# Patient Record
Sex: Female | Born: 1995 | Race: Black or African American | Hispanic: No | Marital: Single | Smoking: Never smoker
Health system: Southern US, Community
[De-identification: ages and names within clinical notes are randomized; demographics above are authoritative.]

## PROBLEM LIST (undated history)

## (undated) DIAGNOSIS — Z789 Other specified health status: Secondary | ICD-10-CM

## (undated) HISTORY — PX: WISDOM TOOTH EXTRACTION: SHX21

## (undated) HISTORY — PX: NO PAST SURGERIES: SHX2092

---
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
## 2016-10-22 ENCOUNTER — Encounter (HOSPITAL_COMMUNITY): Payer: Self-pay | Admitting: Emergency Medicine

## 2016-10-22 ENCOUNTER — Emergency Department (HOSPITAL_COMMUNITY)
Admission: EM | Admit: 2016-10-22 | Discharge: 2016-10-23 | Disposition: A | Discharge status: Home / Self Care | Payer: Self-pay | Attending: Emergency Medicine | Admitting: Emergency Medicine

## 2016-10-22 DIAGNOSIS — Z5321 Procedure and treatment not carried out due to patient leaving prior to being seen by health care provider: Secondary | ICD-10-CM | POA: Insufficient documentation

## 2016-10-22 DIAGNOSIS — R21 Rash and other nonspecific skin eruption: Secondary | ICD-10-CM | POA: Insufficient documentation

## 2016-10-22 NOTE — ED Notes (Signed)
Name called 3 times, no answer.

## 2016-10-22 NOTE — ED Triage Notes (Signed)
Patient reports persistent itchy skin rashes at torso, arms , back and face onset last week unrelieved by OTC Benadryl . Respirations unlabored , no fever , airway intact .

## 2016-10-23 ENCOUNTER — Encounter (HOSPITAL_COMMUNITY): Payer: Self-pay | Admitting: Emergency Medicine

## 2016-10-23 ENCOUNTER — Emergency Department (HOSPITAL_COMMUNITY)
Admission: EM | Admit: 2016-10-23 | Discharge: 2016-10-23 | Disposition: A | Discharge status: Home / Self Care | Payer: Self-pay | Attending: Emergency Medicine | Admitting: Emergency Medicine

## 2016-10-23 DIAGNOSIS — R21 Rash and other nonspecific skin eruption: Secondary | ICD-10-CM | POA: Insufficient documentation

## 2016-10-23 DIAGNOSIS — Z79899 Other long term (current) drug therapy: Secondary | ICD-10-CM | POA: Insufficient documentation

## 2016-10-23 MED ORDER — FAMOTIDINE 20 MG PO TABS: 20.0000 mg | ORAL_TABLET | Freq: Two times a day (BID) | ORAL | 0 refills | Status: DC

## 2016-10-23 MED ORDER — PREDNISONE 10 MG (21) PO TBPK: ORAL_TABLET | Freq: Every day | ORAL | 0 refills | Status: DC

## 2016-10-23 MED ORDER — DIPHENHYDRAMINE HCL 25 MG PO TABS
25.0000 mg | ORAL_TABLET | Freq: Four times a day (QID) | ORAL | 0 refills | Status: DC
Start: 2016-10-23 — End: 2017-01-12

## 2016-10-23 NOTE — ED Notes (Signed)
Pt called for room x2 and no answer

## 2016-10-23 NOTE — ED Notes (Signed)
Pt updated that she was called with no answer an hour ago, Charge Rn aware, pt aware that we will place her in the next available room, pt encouraged to stay in waiting area and listen for name being called

## 2016-10-23 NOTE — ED Notes (Signed)
Pt states she understands instructions. Home stable with steady gait with friend.. 

## 2016-10-23 NOTE — ED Notes (Signed)
Pt called x's 3 without response 

## 2016-10-23 NOTE — ED Triage Notes (Signed)
Pt sts itchy rash generalized worse on torso x 1 week

## 2016-10-23 NOTE — ED Provider Notes (Signed)
[facility]PT Provider Note   CSN: [id number] Arrival date & time: 10/23/16  1259  By signing my name below, I, Bianca Byrd, attest that this documentation has been prepared under the direction and in the presence of [employer]. Electronically Signed: Cynda Byrd, Scribe. 10/23/16. 5:47 PM.  History   Chief Complaint Chief Complaint  Patient presents with  . Rash   HPI Comments: Bianca Byrd is a 21 y.o. female with no pertinent past medical history, who presents to the Emergency Department complaining of a sudden-onset, persistent rash, which appeared three days ago. Patient states initially she only had the rash on her abdomen, but the rash has spread to her bilateral arms, face, and proximal legs. Patient states her rash began while at work, but denies any new work products. Patient denies any new soaps, detergents, or lotion. Patient reports associated itching. Patient reports taking benadryl with some relief. Patient denies any new exposure to animals or outside. Patient denies any trouble swallowing, swelling of the lips, tongue, or throat, chest pain, shortness of breath, or any additional symptoms.   The history is provided by the patient. No language interpreter was used.    History reviewed. No pertinent past medical history.  There are no active problems to display for this patient.   History reviewed. No pertinent surgical history.  OB History    No data available       Home Medications    Prior to Admission medications   Medication Sig Start Date End Date Taking? Authorizing Provider  diphenhydrAMINE (BENADRYL) 25 MG tablet Take 1 tablet (25 mg total) by mouth every 6 (six) hours. 10/23/16   Zlatan Hornback, Waylan Boga, PA-C  famotidine (PEPCID) 20 MG tablet Take 1 tablet (20 mg total) by mouth 2 (two) times daily. 10/23/16   Tiara Maultsby, Waylan Boga, PA-C  Norethindrone Acetate-Ethinyl Estrad-FE (LOESTRIN 24 FE) 1-20 MG-MCG(24) tablet Take 1 tablet by mouth daily.  08/19/15   Brock Bad, MD  predniSONE (STERAPRED UNI-PAK 21 TAB) 10 MG (21) TBPK tablet Take by mouth daily. Take 6 tabs by mouth daily  for 2 days, then 5 tabs for 2 days, then 4 tabs for 2 days, then 3 tabs for 2 days, 2 tabs for 2 days, then 1 tab by mouth daily for 2 days 10/23/16   Emi Holes, PA-C    Family History Family History  Problem Relation Age of Onset  . Cancer - Colon Mother     Social History Social History  Substance Use Topics  . Smoking status: Never Smoker  . Smokeless tobacco: Never Used  . Alcohol use No     Allergies   Patient has no known allergies.   Review of Systems Review of Systems  HENT: Negative for trouble swallowing.   Respiratory: Negative for chest tightness and shortness of breath.   Cardiovascular: Negative for chest pain.  Skin: Positive for rash (Abdomen, bilateral arms, and face).     Physical Exam Updated Vital Signs BP (!) 102/53 (BP Location: Right Arm)   Pulse (!) 54   Temp 98.3 F (36.8 C) (Oral)   Resp 16   Ht 5\' 7"  (1.702 m)   Wt 47.6 kg (105 lb)   LMP 10/02/2016 (Approximate)   SpO2 100%   BMI 16.45 kg/m   Physical Exam  Constitutional: She appears well-developed and well-nourished. No distress.  HENT:  Head: Normocephalic and atraumatic.  Mouth/Throat: Oropharynx is clear and moist. No oropharyngeal exudate.  Eyes: Conjunctivae are normal.  Pupils are equal, round, and reactive to light. Right eye exhibits no discharge. Left eye exhibits no discharge. No scleral icterus.  Neck: Normal range of motion. Neck supple. No thyromegaly present.  Cardiovascular: Regular rhythm, normal heart sounds and intact distal pulses.  Exam reveals no gallop and no friction rub.   No murmur heard. Pulmonary/Chest: Effort normal and breath sounds normal. No stridor. No respiratory distress. She has no wheezes. She has no rales.  Musculoskeletal: She exhibits no edema.  Lymphadenopathy:    She has no cervical adenopathy.    Neurological: She is alert. Coordination normal.  Skin: Skin is warm and dry. Rash noted. She is not diaphoretic. No pallor.  Generalized non tender papular rash to face, torso, and more sparse to the bilateral upper extremities.   Psychiatric: She has a normal mood and affect.  Nursing note and vitals reviewed.        ED Treatments / Results  DIAGNOSTIC STUDIES: Oxygen Saturation is 100% on RA, normal by my interpretation.    COORDINATION OF CARE: 5:46 PM Discussed treatment plan with pt at bedside and pt agreed to plan, which includes Pepcid, benadryl, and prednisone.   Labs (all labs ordered are listed, but only abnormal results are displayed) Labs Reviewed - No data to display  EKG  EKG Interpretation None       Radiology No results found.  Procedures Procedures (including critical care time)  Medications Ordered in ED Medications - No data to display   Initial Impression / Assessment and Plan / ED Course  I have reviewed the triage vital signs and the nursing notes.  Pertinent labs & imaging results that were available during my care of the patient were reviewed by me and considered in my medical decision making (see chart for details).     Suspect systemic allergic reaction to some unknown allergen. Rash spares palms and soles. Patient has no other symptoms and is afebrile. Doubt any other more emergent etiology at this time. Will treat with prednisone taper, Benadryl, Pepcid with strict return precautions. Patient understands and agrees with plan. Patient vitals stable throughout ED course and discharged in satisfactory condition. Patient also evaluated by Dr. Donnald GarrePfeiffer that of the patient's management and agrees with plan.  Final Clinical Impressions(s) / ED Diagnoses   Final diagnoses:  Rash and nonspecific skin eruption    New Prescriptions New Prescriptions   DIPHENHYDRAMINE (BENADRYL) 25 MG TABLET    Take 1 tablet (25 mg total) by mouth every 6  (six) hours.   FAMOTIDINE (PEPCID) 20 MG TABLET    Take 1 tablet (20 mg total) by mouth 2 (two) times daily.   PREDNISONE (STERAPRED UNI-PAK 21 TAB) 10 MG (21) TBPK TABLET    Take by mouth daily. Take 6 tabs by mouth daily  for 2 days, then 5 tabs for 2 days, then 4 tabs for 2 days, then 3 tabs for 2 days, 2 tabs for 2 days, then 1 tab by mouth daily for 2 days   I personally performed the services described in this documentation, which was scribed in my presence. The recorded information has been reviewed and is accurate.       Emi HolesLaw, Hiroto Saltzman M, PA-C 10/23/16 1755    Arby BarrettePfeiffer, Marcy, MD 10/24/16 [phone]

## 2016-10-23 NOTE — Discharge Instructions (Signed)
Medications: prednisone, Benadryl, Pepcid  Treatment: Take prednisone as prescribed for 12 days. Take Benadryl every 6 hours for the next 3 days and thereafter as needed for itching. Take Pepcid twice daily as prescribed for the next 3 days.  Follow-up: Please follow-up and establish care with a primary care provider for future allergy testing. You can call the number circled on your discharge paperwork. Please return to emergency department if you develop any new or worsening symptoms, or if your rash is not improving.

## 2017-01-01 ENCOUNTER — Emergency Department (HOSPITAL_COMMUNITY)
Admission: EM | Admit: 2017-01-01 | Discharge: 2017-01-01 | Disposition: A | Discharge status: Home / Self Care | Payer: Self-pay | Attending: Emergency Medicine | Admitting: Emergency Medicine

## 2017-01-01 ENCOUNTER — Emergency Department (HOSPITAL_COMMUNITY): Payer: Self-pay

## 2017-01-01 DIAGNOSIS — W500XXA Accidental hit or strike by another person, initial encounter: Secondary | ICD-10-CM | POA: Insufficient documentation

## 2017-01-01 DIAGNOSIS — Y9383 Activity, rough housing and horseplay: Secondary | ICD-10-CM | POA: Insufficient documentation

## 2017-01-01 DIAGNOSIS — Z79899 Other long term (current) drug therapy: Secondary | ICD-10-CM | POA: Insufficient documentation

## 2017-01-01 DIAGNOSIS — Y92008 Other place in unspecified non-institutional (private) residence as the place of occurrence of the external cause: Secondary | ICD-10-CM | POA: Insufficient documentation

## 2017-01-01 DIAGNOSIS — Y999 Unspecified external cause status: Secondary | ICD-10-CM | POA: Insufficient documentation

## 2017-01-01 DIAGNOSIS — S62610A Displaced fracture of proximal phalanx of right index finger, initial encounter for closed fracture: Secondary | ICD-10-CM | POA: Insufficient documentation

## 2017-01-01 MED ORDER — OXYCODONE-ACETAMINOPHEN 5-325 MG PO TABS: 1.0000 | ORAL_TABLET | Freq: Three times a day (TID) | ORAL | 0 refills | Status: DC | PRN

## 2017-01-01 NOTE — Progress Notes (Signed)
Orthopedic Tech Progress Note Patient Details:  Bianca Byrd [birth year] [id number]  Ortho Devices Type of Ortho Device: Short arm splint Ortho Device/Splint Location: applied short arm splint to pt right arm/hand (ulna gutter).  pt tolerated application very well.  provided arm sling to pt right arm for support.  right hand.  Ortho Device/Splint Interventions: Application, Adjustment   Alvina Chou 01/01/2017, 5:12 PM
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
## 2017-01-01 NOTE — ED Notes (Signed)
Ortho at bedside.

## 2017-01-01 NOTE — Discharge Instructions (Signed)
Please keep your splint clean and dry and in place until you're evaluated by the hand surgeon. You may cover with a plastic bag shower.  Please call the hand surgeon's office on Monday morning to schedule a follow-up appointment.  If you develop any numbness, weakness, or loss of sensation in the finger, or have a new fall or injury, please return to the emergency department for reevaluation.  800 mg of ibuprofen may be taken with food every 8 hours as needed for pain. You may also take 650 mg of Tylenol every 6 hours. For severe pain, you may take 1 tablet of Percocet once every 8 hours. Please do not drive or work after taking this medication because it can make you drowsy. Please note this medication is a narcotic and can be addicting so please only use it for severe pain.
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
## 2017-01-04 ENCOUNTER — Telehealth: Payer: Self-pay | Admitting: Orthopedic Surgery

## 2017-01-04 NOTE — Telephone Encounter (Signed)
Left message reiterating to f/u with [place] [facility] and left office # to call.    Freeman Caldron, PA-C Orthopedic Surgery [phone]
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
## 2017-01-12 ENCOUNTER — Encounter (HOSPITAL_COMMUNITY): Payer: Self-pay | Admitting: *Deleted

## 2017-01-13 ENCOUNTER — Ambulatory Visit (HOSPITAL_COMMUNITY): Payer: Self-pay | Admitting: Anesthesiology

## 2017-01-13 ENCOUNTER — Encounter (HOSPITAL_COMMUNITY): Payer: Self-pay | Admitting: Orthopedic Surgery

## 2017-01-13 ENCOUNTER — Ambulatory Visit (HOSPITAL_COMMUNITY)
Admission: RE | Admit: 2017-01-13 | Discharge: 2017-01-13 | Disposition: A | Discharge status: Home / Self Care | Payer: Self-pay | Source: Ambulatory Visit | Attending: Orthopedic Surgery | Admitting: Orthopedic Surgery

## 2017-01-13 ENCOUNTER — Encounter (HOSPITAL_COMMUNITY)
Admission: RE | Disposition: A | Discharge status: Home / Self Care | Payer: Self-pay | Source: Ambulatory Visit | Attending: Orthopedic Surgery

## 2017-01-13 DIAGNOSIS — X58XXXA Exposure to other specified factors, initial encounter: Secondary | ICD-10-CM | POA: Insufficient documentation

## 2017-01-13 DIAGNOSIS — S62610B Displaced fracture of proximal phalanx of right index finger, initial encounter for open fracture: Secondary | ICD-10-CM

## 2017-01-13 DIAGNOSIS — S62610A Displaced fracture of proximal phalanx of right index finger, initial encounter for closed fracture: Secondary | ICD-10-CM | POA: Insufficient documentation

## 2017-01-13 HISTORY — PX: OPEN REDUCTION INTERNAL FIXATION (ORIF) DISTAL PHALANX: SHX6236

## 2017-01-13 HISTORY — DX: Other specified health status: Z78.9

## 2017-01-13 LAB — CBC
HCT: 40.8 % (ref 36.0–46.0)
HEMOGLOBIN: 13.1 g/dL (ref 12.0–15.0)
MCH: 28.1 pg (ref 26.0–34.0)
MCHC: 32.1 g/dL (ref 30.0–36.0)
MCV: 87.6 fL (ref 78.0–100.0)
Platelets: 166 10*3/uL (ref 150–400)
RBC: 4.66 MIL/uL (ref 3.87–5.11)
RDW: 12.7 % (ref 11.5–15.5)
WBC: 6.3 10*3/uL (ref 4.0–10.5)

## 2017-01-13 LAB — HCG, SERUM, QUALITATIVE: PREG SERUM: NEGATIVE

## 2017-01-13 SURGERY — OPEN REDUCTION INTERNAL FIXATION (ORIF) DISTAL PHALANX
Anesthesia: General | Site: Index Finger | Laterality: Right

## 2017-01-13 MED ORDER — SCOPOLAMINE 1 MG/3DAYS TD PT72
MEDICATED_PATCH | TRANSDERMAL | Status: AC
  Filled 2017-01-13: qty 1

## 2017-01-13 MED ORDER — SCOPOLAMINE 1 MG/3DAYS TD PT72
MEDICATED_PATCH | TRANSDERMAL | Status: DC | PRN
  Administered 2017-01-13: 1 via TRANSDERMAL

## 2017-01-13 MED ORDER — LIDOCAINE 2% (20 MG/ML) 5 ML SYRINGE
INTRAMUSCULAR | Status: AC
  Filled 2017-01-13: qty 5

## 2017-01-13 MED ORDER — MIDAZOLAM HCL 2 MG/2ML IJ SOLN
INTRAMUSCULAR | Status: AC
  Filled 2017-01-13: qty 2

## 2017-01-13 MED ORDER — MIDAZOLAM HCL 2 MG/2ML IJ SOLN
INTRAMUSCULAR | Status: DC | PRN
  Administered 2017-01-13: 2 mg via INTRAVENOUS

## 2017-01-13 MED ORDER — LACTATED RINGERS IV SOLN
INTRAVENOUS | Status: DC
  Administered 2017-01-13: 15:00:00 via INTRAVENOUS

## 2017-01-13 MED ORDER — PROMETHAZINE HCL 25 MG/ML IJ SOLN: 6.2500 mg | INTRAMUSCULAR | Status: DC | PRN

## 2017-01-13 MED ORDER — ONDANSETRON HCL 4 MG/2ML IJ SOLN
INTRAMUSCULAR | Status: DC | PRN
  Administered 2017-01-13: 4 mg via INTRAVENOUS

## 2017-01-13 MED ORDER — BUPIVACAINE HCL (PF) 0.25 % IJ SOLN
INTRAMUSCULAR | Status: AC
  Filled 2017-01-13: qty 30

## 2017-01-13 MED ORDER — ONDANSETRON HCL 4 MG/2ML IJ SOLN
INTRAMUSCULAR | Status: AC
  Filled 2017-01-13: qty 2

## 2017-01-13 MED ORDER — HYDROMORPHONE HCL 1 MG/ML IJ SOLN
0.2500 mg | INTRAMUSCULAR | Status: DC | PRN
  Administered 2017-01-13 (×2): 0.5 mg via INTRAVENOUS

## 2017-01-13 MED ORDER — FENTANYL CITRATE (PF) 250 MCG/5ML IJ SOLN
INTRAMUSCULAR | Status: AC
  Filled 2017-01-13: qty 5

## 2017-01-13 MED ORDER — DEXAMETHASONE SODIUM PHOSPHATE 10 MG/ML IJ SOLN
INTRAMUSCULAR | Status: DC | PRN
  Administered 2017-01-13: 10 mg via INTRAVENOUS

## 2017-01-13 MED ORDER — CEFAZOLIN SODIUM-DEXTROSE 2-4 GM/100ML-% IV SOLN
2.0000 g | INTRAVENOUS | Status: AC
  Administered 2017-01-13: 2 g via INTRAVENOUS
  Filled 2017-01-13: qty 100

## 2017-01-13 MED ORDER — LIDOCAINE 2% (20 MG/ML) 5 ML SYRINGE
INTRAMUSCULAR | Status: DC | PRN
  Administered 2017-01-13: 60 mg via INTRAVENOUS

## 2017-01-13 MED ORDER — FENTANYL CITRATE (PF) 100 MCG/2ML IJ SOLN
INTRAMUSCULAR | Status: DC | PRN
  Administered 2017-01-13: 25 ug via INTRAVENOUS
  Administered 2017-01-13 (×2): 50 ug via INTRAVENOUS
  Administered 2017-01-13: 75 ug via INTRAVENOUS
  Administered 2017-01-13: 50 ug via INTRAVENOUS

## 2017-01-13 MED ORDER — PROPOFOL 10 MG/ML IV BOLUS
INTRAVENOUS | Status: DC | PRN
  Administered 2017-01-13: 180 mg via INTRAVENOUS

## 2017-01-13 MED ORDER — CHLORHEXIDINE GLUCONATE 4 % EX LIQD: 60.0000 mL | Freq: Once | CUTANEOUS | Status: DC

## 2017-01-13 MED ORDER — OXYCODONE-ACETAMINOPHEN 5-325 MG PO TABS
ORAL_TABLET | ORAL | Status: AC
  Administered 2017-01-13: 1
  Filled 2017-01-13: qty 1

## 2017-01-13 MED ORDER — PROPOFOL 10 MG/ML IV BOLUS
INTRAVENOUS | Status: AC
  Filled 2017-01-13: qty 20

## 2017-01-13 MED ORDER — DEXAMETHASONE SODIUM PHOSPHATE 10 MG/ML IJ SOLN
INTRAMUSCULAR | Status: AC
  Filled 2017-01-13: qty 1

## 2017-01-13 MED ORDER — OXYCODONE-ACETAMINOPHEN 5-325 MG PO TABS: 1.0000 | ORAL_TABLET | Freq: Three times a day (TID) | ORAL | 0 refills | Status: AC

## 2017-01-13 MED ORDER — HYDROMORPHONE HCL 1 MG/ML IJ SOLN
INTRAMUSCULAR | Status: AC
  Administered 2017-01-13: 0.5 mg via INTRAVENOUS
  Filled 2017-01-13: qty 1

## 2017-01-13 MED ORDER — BUPIVACAINE HCL (PF) 0.25 % IJ SOLN
INTRAMUSCULAR | Status: DC | PRN
  Administered 2017-01-13: 8.5 mL

## 2017-01-13 SURGICAL SUPPLY — 66 items
BANDAGE ACE 3X5.8 VEL STRL LF (GAUZE/BANDAGES/DRESSINGS) ×4 IMPLANT
BANDAGE ACE 4X5 VEL STRL LF (GAUZE/BANDAGES/DRESSINGS) IMPLANT
BIT DRILL 1.1 (BIT) ×1
BIT DRILL 1.1MM (BIT) ×1
BIT DRILL 60X20X1.1XQC TMX (BIT) ×2 IMPLANT
BIT DRL 60X20X1.1XQC TMX (BIT) ×2
BLADE CLIPPER SURG (BLADE) IMPLANT
BNDG ESMARK 4X9 LF (GAUZE/BANDAGES/DRESSINGS) ×4 IMPLANT
BNDG GAUZE ELAST 4 BULKY (GAUZE/BANDAGES/DRESSINGS) IMPLANT
CLOSURE WOUND 1/2 X4 (GAUZE/BANDAGES/DRESSINGS)
CORDS BIPOLAR (ELECTRODE) ×4 IMPLANT
COVER SURGICAL LIGHT HANDLE (MISCELLANEOUS) ×4 IMPLANT
CUFF TOURNIQUET SINGLE 18IN (TOURNIQUET CUFF) ×4 IMPLANT
CUFF TOURNIQUET SINGLE 24IN (TOURNIQUET CUFF) IMPLANT
DRAPE OEC MINIVIEW 54X84 (DRAPES) ×4 IMPLANT
DRAPE SURG 17X11 SM STRL (DRAPES) ×4 IMPLANT
DRIVER BIT 1.5 (TRAUMA) ×4 IMPLANT
DRSG ADAPTIC 3X8 NADH LF (GAUZE/BANDAGES/DRESSINGS) ×4 IMPLANT
GAUZE SPONGE 4X4 12PLY STRL (GAUZE/BANDAGES/DRESSINGS) ×4 IMPLANT
GAUZE SPONGE 4X4 16PLY XRAY LF (GAUZE/BANDAGES/DRESSINGS) IMPLANT
GLOVE BIO SURGEON STRL SZ 6.5 (GLOVE) ×3 IMPLANT
GLOVE BIO SURGEONS STRL SZ 6.5 (GLOVE) ×1
GLOVE BIOGEL PI IND STRL 6.5 (GLOVE) ×2 IMPLANT
GLOVE BIOGEL PI IND STRL 7.0 (GLOVE) ×2 IMPLANT
GLOVE BIOGEL PI IND STRL 8.5 (GLOVE) ×2 IMPLANT
GLOVE BIOGEL PI INDICATOR 6.5 (GLOVE) ×2
GLOVE BIOGEL PI INDICATOR 7.0 (GLOVE) ×2
GLOVE BIOGEL PI INDICATOR 8.5 (GLOVE) ×2
GLOVE ECLIPSE 6.5 STRL STRAW (GLOVE) ×4 IMPLANT
GLOVE SURG ORTHO 8.0 STRL STRW (GLOVE) ×4 IMPLANT
GOWN STRL REUS W/ TWL LRG LVL3 (GOWN DISPOSABLE) ×8 IMPLANT
GOWN STRL REUS W/ TWL XL LVL3 (GOWN DISPOSABLE) ×2 IMPLANT
GOWN STRL REUS W/TWL LRG LVL3 (GOWN DISPOSABLE) ×8
GOWN STRL REUS W/TWL XL LVL3 (GOWN DISPOSABLE) ×2
KIT BASIN OR (CUSTOM PROCEDURE TRAY) ×4 IMPLANT
KIT ROOM TURNOVER OR (KITS) ×4 IMPLANT
MANIFOLD NEPTUNE II (INSTRUMENTS) ×4 IMPLANT
NEEDLE HYPO 25X1 1.5 SAFETY (NEEDLE) ×4 IMPLANT
NS IRRIG 1000ML POUR BTL (IV SOLUTION) ×4 IMPLANT
PACK ORTHO EXTREMITY (CUSTOM PROCEDURE TRAY) ×4 IMPLANT
PAD ARMBOARD 7.5X6 YLW CONV (MISCELLANEOUS) ×8 IMPLANT
PAD CAST 4YDX4 CTTN HI CHSV (CAST SUPPLIES) ×2 IMPLANT
PADDING CAST COTTON 4X4 STRL (CAST SUPPLIES) ×2
PADDING UNDERCAST 2 STRL (CAST SUPPLIES) ×2
PADDING UNDERCAST 2X4 STRL (CAST SUPPLIES) ×2 IMPLANT
PLATE STRAIGHT LOCK 1.5 (Plate) ×4 IMPLANT
SCREW NL 1.5X11 WRIST (Screw) ×12 IMPLANT
SCREW NL 1.5X12 (Screw) ×8 IMPLANT
SOAP 2 % CHG 4 OZ (WOUND CARE) ×4 IMPLANT
SPLINT FINGER (SOFTGOODS) ×4 IMPLANT
STRIP CLOSURE SKIN 1/2X4 (GAUZE/BANDAGES/DRESSINGS) IMPLANT
SUT ETHIBOND 4 0 TF (SUTURE) ×4 IMPLANT
SUT ETHILON 4 0 PS 2 18 (SUTURE) IMPLANT
SUT MNCRL AB 3-0 PS2 18 (SUTURE) ×4 IMPLANT
SUT MNCRL AB 4-0 PS2 18 (SUTURE) IMPLANT
SUT PROLENE 4 0 PS 2 18 (SUTURE) IMPLANT
SUT VIC AB 2-0 FS1 27 (SUTURE) IMPLANT
SUT VICRYL 4-0 PS2 18IN ABS (SUTURE) IMPLANT
SUT VICRYL RAPIDE 4/0 PS 2 (SUTURE) ×4 IMPLANT
SYR CONTROL 10ML LL (SYRINGE) ×4 IMPLANT
TOWEL OR 17X24 6PK STRL BLUE (TOWEL DISPOSABLE) ×4 IMPLANT
TOWEL OR 17X26 10 PK STRL BLUE (TOWEL DISPOSABLE) ×4 IMPLANT
TUBE CONNECTING 12'X1/4 (SUCTIONS) ×1
TUBE CONNECTING 12X1/4 (SUCTIONS) ×3 IMPLANT
WATER STERILE IRR 1000ML POUR (IV SOLUTION) ×4 IMPLANT
YANKAUER SUCT BULB TIP NO VENT (SUCTIONS) IMPLANT

## 2017-01-13 NOTE — Transfer of Care (Signed)
Immediate Anesthesia Transfer of Care Note  Patient: Bianca Byrd  Procedure(s) Performed: Procedure(s): RIGHT INDEX FINGER CLOSED REDUCTION WITH PERCUTANEOUS SKELETAL FIXATION VERSUS OPEN REDUCTION INTERNAL FIXATION (ORIF) FINGER (Right)  Patient Location: PACU  Anesthesia Type:General  Level of Consciousness: awake and alert   Airway & Oxygen Therapy: Patient Spontanous Breathing  Post-op Assessment: Report given to RN  Post vital signs: Reviewed and stable  Last Vitals:  Vitals:   01/13/17 1502  BP: (!) 108/50  Pulse: (!) 54  Resp: 18  Temp: 36.8 C  SpO2: 100%    Last Pain:  Vitals:   01/13/17 1502  TempSrc: Oral  PainSc:          Complications: No apparent anesthesia complications
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
## 2017-01-13 NOTE — Anesthesia Procedure Notes (Signed)
Procedure Name: LMA Insertion Date/Time: 01/13/2017 4:54 PM Performed by: Jefm Miles E Pre-anesthesia Checklist: Patient identified, Emergency Drugs available, Suction available and Patient being monitored Patient Re-evaluated:Patient Re-evaluated prior to induction Oxygen Delivery Method: Circle System Utilized Preoxygenation: Pre-oxygenation with 100% oxygen Induction Type: IV induction Ventilation: Mask ventilation without difficulty LMA: LMA inserted LMA Size: 4.0 Number of attempts: 1 Placement Confirmation: positive ETCO2 Tube secured with: Tape Dental Injury: Teeth and Oropharynx as per pre-operative assessment

## 2017-01-13 NOTE — Anesthesia Preprocedure Evaluation (Signed)
Anesthesia Evaluation  Patient identified by MRN, date of birth, ID band Patient awake    Reviewed: Allergy & Precautions, NPO status , Patient's Chart, lab work & pertinent test results  History of Anesthesia Complications Negative for: history of anesthetic complications  Airway Mallampati: II  TM Distance: >3 FB Neck ROM: Full    Dental no notable dental hx. (+) Dental Advisory Given   Pulmonary neg pulmonary ROS,    Pulmonary exam normal        Cardiovascular negative cardio ROS Normal cardiovascular exam     Neuro/Psych negative neurological ROS  negative psych ROS   GI/Hepatic negative GI ROS, Neg liver ROS,   Endo/Other  negative endocrine ROS  Renal/GU negative Renal ROS     Musculoskeletal   Abdominal   Peds  Hematology negative hematology ROS (+)   Anesthesia Other Findings   Reproductive/Obstetrics                             Anesthesia Physical Anesthesia Plan  ASA: I  Anesthesia Plan: General   Post-op Pain Management:    Induction: Intravenous  PONV Risk Score and Plan: 3 and Ondansetron, Dexamethasone and Diphenhydramine  Airway Management Planned: LMA  Additional Equipment:   Intra-op Plan:   Post-operative Plan: Extubation in OR  Informed Consent: I have reviewed the patients History and Physical, chart, labs and discussed the procedure including the risks, benefits and alternatives for the proposed anesthesia with the patient or authorized representative who has indicated his/her understanding and acceptance.   Dental advisory given  Plan Discussed with: CRNA and Anesthesiologist  Anesthesia Plan Comments:         Anesthesia Quick Evaluation

## 2017-01-13 NOTE — Anesthesia Postprocedure Evaluation (Signed)
Anesthesia Post Note  Patient: Bianca Byrd  Procedure(s) Performed: Procedure(s) (LRB): RIGHT INDEX FINGER CLOSED REDUCTION WITH PERCUTANEOUS SKELETAL FIXATION VERSUS OPEN REDUCTION INTERNAL FIXATION (ORIF) FINGER (Right)     Patient location during evaluation: PACU Anesthesia Type: General Level of consciousness: awake and alert Pain management: pain level controlled Vital Signs Assessment: post-procedure vital signs reviewed and stable Respiratory status: spontaneous breathing, nonlabored ventilation and respiratory function stable Cardiovascular status: blood pressure returned to baseline and stable Postop Assessment: no apparent nausea or vomiting Anesthetic complications: no    Last Vitals:  Vitals:   01/13/17 1839 01/13/17 1841  BP: 119/62   Pulse: 62 62  Resp: 20 17  Temp:  36.7 C  SpO2: 100% 100%    Last Pain:  Vitals:   01/13/17 1841  TempSrc:   PainSc: 0-No pain                 Jyllian Haynie,W. EDMOND

## 2017-01-13 NOTE — Discharge Instructions (Signed)
KEEP BANDAGE CLEAN AND DRY CALL OFFICE FOR F/U APPT [phone] KEEP HAND ELEVATED ABOVE HEART OK TO APPLY ICE TO OPERATIVE AREA CONTACT OFFICE IF ANY WORSENING PAIN OR CONCERNS.    General Anesthesia, Adult, Care After These instructions provide you with information about caring for yourself after your procedure. Your health care provider may also give you more specific instructions. Your treatment has been planned according to current medical practices, but problems sometimes occur. Call your health care provider if you have any problems or questions after your procedure. What can I expect after the procedure? After the procedure, it is common to have:  Vomiting.  A sore throat.  Mental slowness.  It is common to feel:  Nauseous.  Cold or shivery.  Sleepy.  Tired.  Sore or achy, even in parts of your body where you did not have surgery.  Follow these instructions at home: For at least 24 hours after the procedure:  Do not: ? Participate in activities where you could fall or become injured. ? Drive. ? Use heavy machinery. ? Drink alcohol. ? Take sleeping pills or medicines that cause drowsiness. ? Make important decisions or sign legal documents. ? Take care of children on your own.  Rest. Eating and drinking  If you vomit, drink water, juice, or soup when you can drink without vomiting.  Drink enough fluid to keep your urine clear or pale yellow.  Make sure you have little or no nausea before eating solid foods.  Follow the diet recommended by your health care provider. General instructions  Have a responsible adult stay with you until you are awake and alert.  Return to your normal activities as told by your health care provider. Ask your health care provider what activities are safe for you.  Take over-the-counter and prescription medicines only as told by your health care provider.  If you smoke, do not smoke without supervision.  Keep all follow-up  visits as told by your health care provider. This is important. Contact a health care provider if:  You continue to have nausea or vomiting at home, and medicines are not helpful.  You cannot drink fluids or start eating again.  You cannot urinate after 8-12 hours.  You develop a skin rash.  You have fever.  You have increasing redness at the site of your procedure. Get help right away if:  You have difficulty breathing.  You have chest pain.  You have unexpected bleeding.  You feel that you are having a life-threatening or urgent problem. This information is not intended to replace advice given to you by your health care provider. Make sure you discuss any questions you have with your health care provider. Document Released: 07/13/2000 Document Revised: 09/09/2015 Document Reviewed: 03/21/2015 Elsevier Interactive Patient Education  [employer].

## 2017-01-13 NOTE — Anesthesia Preprocedure Evaluation (Signed)
Anesthesia Evaluation  Patient identified by MRN, date of birth, ID band  Reviewed: Allergy & Precautions, NPO status , Patient's Chart, lab work & pertinent test results  Airway Mallampati: I  TM Distance: >3 FB Neck ROM: Full    Dental  (+) Teeth Intact, Dental Advisory Given   Pulmonary neg pulmonary ROS,    breath sounds clear to auscultation       Cardiovascular negative cardio ROS   Rhythm:Regular Rate:Normal     Neuro/Psych negative neurological ROS     GI/Hepatic negative GI ROS, Neg liver ROS,   Endo/Other  negative endocrine ROS  Renal/GU negative Renal ROS     Musculoskeletal negative musculoskeletal ROS (+)   Abdominal   Peds  Hematology negative hematology ROS (+)   Anesthesia Other Findings Day of surgery medications reviewed with the patient.  Reproductive/Obstetrics                             Anesthesia Physical Anesthesia Plan  ASA: I  Anesthesia Plan: General   Post-op Pain Management:    Induction: Intravenous  PONV Risk Score and Plan: 4 or greater and Ondansetron, Dexamethasone, Midazolam, Scopolamine patch - Pre-op and Treatment may vary due to age or medical condition  Airway Management Planned: LMA  Additional Equipment:   Intra-op Plan:   Post-operative Plan: Extubation in OR  Informed Consent: I have reviewed the patients History and Physical, chart, labs and discussed the procedure including the risks, benefits and alternatives for the proposed anesthesia with the patient or authorized representative who has indicated his/her understanding and acceptance.   Dental advisory given  Plan Discussed with: CRNA  Anesthesia Plan Comments:         Anesthesia Quick Evaluation

## 2017-01-13 NOTE — Op Note (Signed)
PREOPERATIVE DIAGNOSIS: Right index finger displaced proximal phalangeal shaft fracture  POSTOPERATIVE DIAGNOSIS: Same  ATTENDING SURGEON: Dr. Gilman Schmidt who was scrubbed and present for the entire procedure  ASSISTANT SURGEON: Lambert Mody PA-C was scrubbed and necessary for internal fixation application of splint and closure in a timely fashion  ANESTHESIA: Gen.  OPERATIVE PROCEDURE:1. Open reduction internal fixation of displaced proximal phalangeal shaft fracture, 78295, right index finger #2: Radiographs 3 views right index finger, 73140  IMPLANTS: [employer]omet hand Alps 1.5 mm straight plate total of 5 nonlocking screws  RADIOGRAPHIC INTERPRETATION: AP lateral oblique views of the finger do show the dorsal plate fixation in good position with good alignment of the fracture site and good joint congruity  SURGICAL INDICATIONS: Patient is a right-hand-dominant female who sustained a closed injury to right index finger. Patient was seen and evaluated in the office and given the displacement and the malrotation was recommended she undergo the above procedure. Risks benefits and alternatives were discussed in detail with the patient and signed informed consent was obtained. Risks include but not limited to bleeding infection damage to nearby nerves arteries or tendons loss of motion of the wrists and digits incomplete relief of symptoms and need for further surgical intervention  SURGICAL TECHNIQUE: Patient is properly identified in the preoperative holding area marked with a permanent marker made on the right index finger to indicate correct operative site. Patient brought back to operating room placed supine on anesthesia and table general anesthesia was administered. Preoperative antibiotics were given prior to any skin incision. A well-padded tourniquet placed on the right brachium and sealed with the appropriate drape. The right upper extremity is and prepped and draped in normal sterile  fashion. Timeout was called the correct site was notified the procedure then begun. Longitudinal incision made directly over the dorsal aspect of the small finger proximal phalanx. The extensor mechanism was incised longitudinally. The periosteal layer was incised and protected. The fracture site was opened up. Fracture hematoma was evacuated. An open reduction was then performed. This is held in place with a reduction clamp. The dorsal plate was then fashioned and placed and held proximally distally with K wires. After confirmation of adequate plate position screw fixation was carried out with the appropriate drill bit and depth gauge measurement with a 1.5 mm screws. The wound was then thoroughly irrigated. Final radiographs obtained. Periosteal layer was then closed with Monocryl suture. The extensor mechanism closed with 4-0 Ethibond suture. Skin was then closed with 4-0 Vicryl repeat sutures. Adaptic dressing sterile compressive bandage then applied. The patient was placed in a small finger splint explained taken recovery room in good condition.  POSTOPERATIVE PLAN: Patient be discharged to home seen back now office and proxy 10-12 days for wound check right her therapy prescription to begin outpatient therapy [facility] printing off the therapy protocol for ORIF of the proximal phalanx with a plate and screw construct. Radiographs at each visit.

## 2017-01-14 ENCOUNTER — Encounter (HOSPITAL_COMMUNITY): Payer: Self-pay | Admitting: Orthopedic Surgery

## 2018-09-16 IMAGING — CR DG FINGER INDEX 2+V*R*
3 series · 3 of 3 positions shown · non-contrast
Comparison: None.

ADDENDUM:
Correction:  Images and findings were of the right index finger.
CLINICAL DATA: Right index finger bruising and swelling after
injuring the finger 2 days ago.

EXAM:
RIGHT INDEX FINGER 2+V

[finger ap]
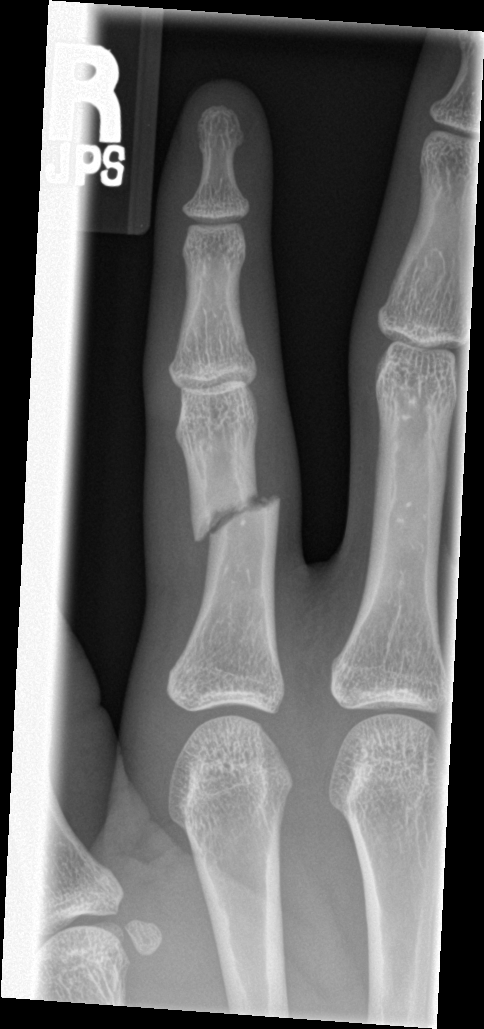

[finger obl]
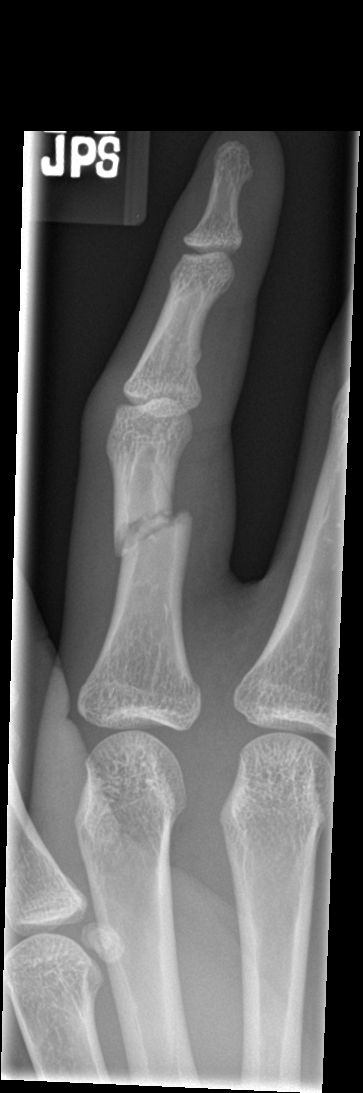

[finger lat]
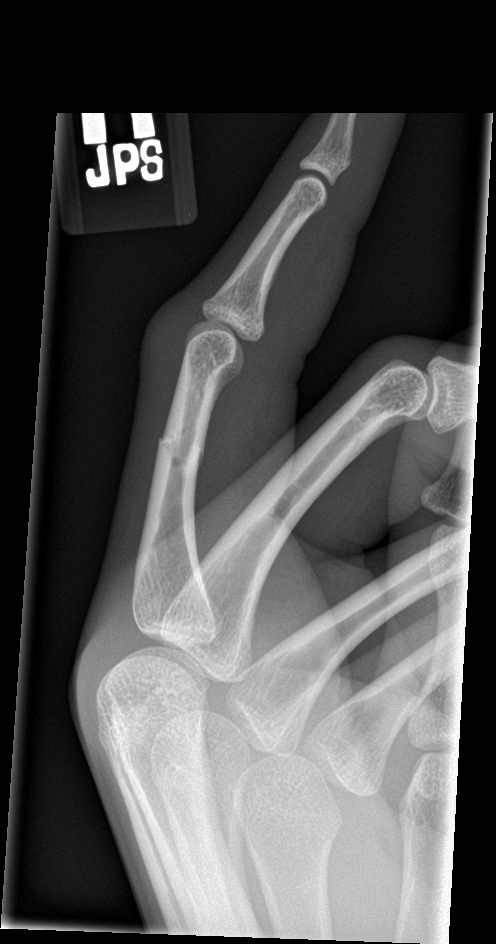

[3 of 3 positions shown; findings below may reference images not displayed]

FINDINGS: There is an acute, oblique, fracture involving the midshaft of the
right fourth proximal phalanx with approximately 2 mm of lateral
displacement of the distal fracture fragment. No intra-articular
involvement. Associated soft tissue swelling of the finger is noted.
No joint dislocations.
IMPRESSION: There is an acute, closed, oblique, fracture involving the midshaft
of the right fourth proximal phalanx with approximately 2 mm of
lateral displacement of the distal fracture fragment.
